# Patient Record
Sex: Female | Born: 1955 | Race: Black or African American | Hispanic: No | Marital: Married | Smoking: Former smoker
Health system: Southern US, Community
[De-identification: ages and names within clinical notes are randomized; demographics above are authoritative.]

## PROBLEM LIST (undated history)

## (undated) DIAGNOSIS — I639 Cerebral infarction, unspecified: Secondary | ICD-10-CM

## (undated) DIAGNOSIS — C189 Malignant neoplasm of colon, unspecified: Secondary | ICD-10-CM

## (undated) HISTORY — PX: SHOULDER SURGERY: SHX246

## (undated) HISTORY — PX: ABDOMINAL HYSTERECTOMY: SHX81

---

## 2020-11-12 DIAGNOSIS — Z1231 Encounter for screening mammogram for malignant neoplasm of breast: Secondary | ICD-10-CM | POA: Diagnosis not present

## 2020-11-12 DIAGNOSIS — Z0000 Encounter for general adult medical examination without abnormal findings: Secondary | ICD-10-CM | POA: Diagnosis not present

## 2020-11-12 DIAGNOSIS — Z85038 Personal history of other malignant neoplasm of large intestine: Secondary | ICD-10-CM | POA: Diagnosis not present

## 2020-11-12 DIAGNOSIS — Z780 Asymptomatic menopausal state: Secondary | ICD-10-CM | POA: Diagnosis not present

## 2020-11-12 DIAGNOSIS — M25511 Pain in right shoulder: Secondary | ICD-10-CM | POA: Diagnosis not present

## 2020-11-12 DIAGNOSIS — Z1509 Genetic susceptibility to other malignant neoplasm: Secondary | ICD-10-CM | POA: Diagnosis not present

## 2020-11-13 ENCOUNTER — Other Ambulatory Visit: Payer: Self-pay | Admitting: Internal Medicine

## 2020-11-13 DIAGNOSIS — Z1231 Encounter for screening mammogram for malignant neoplasm of breast: Secondary | ICD-10-CM

## 2021-01-28 DIAGNOSIS — Z1509 Genetic susceptibility to other malignant neoplasm: Secondary | ICD-10-CM | POA: Diagnosis not present

## 2021-01-28 DIAGNOSIS — K5904 Chronic idiopathic constipation: Secondary | ICD-10-CM | POA: Diagnosis not present

## 2021-01-28 DIAGNOSIS — Z85038 Personal history of other malignant neoplasm of large intestine: Secondary | ICD-10-CM | POA: Diagnosis not present

## 2021-01-28 DIAGNOSIS — Z8673 Personal history of transient ischemic attack (TIA), and cerebral infarction without residual deficits: Secondary | ICD-10-CM | POA: Diagnosis not present

## 2021-03-18 DIAGNOSIS — C182 Malignant neoplasm of ascending colon: Secondary | ICD-10-CM | POA: Diagnosis not present

## 2021-03-18 DIAGNOSIS — I10 Essential (primary) hypertension: Secondary | ICD-10-CM | POA: Diagnosis not present

## 2021-03-18 DIAGNOSIS — K219 Gastro-esophageal reflux disease without esophagitis: Secondary | ICD-10-CM | POA: Diagnosis not present

## 2021-03-18 DIAGNOSIS — M75111 Incomplete rotator cuff tear or rupture of right shoulder, not specified as traumatic: Secondary | ICD-10-CM | POA: Diagnosis not present

## 2021-04-08 DIAGNOSIS — Z980 Intestinal bypass and anastomosis status: Secondary | ICD-10-CM | POA: Diagnosis not present

## 2021-04-08 DIAGNOSIS — K449 Diaphragmatic hernia without obstruction or gangrene: Secondary | ICD-10-CM | POA: Diagnosis not present

## 2021-04-08 DIAGNOSIS — K2950 Unspecified chronic gastritis without bleeding: Secondary | ICD-10-CM | POA: Diagnosis not present

## 2021-04-08 DIAGNOSIS — K5730 Diverticulosis of large intestine without perforation or abscess without bleeding: Secondary | ICD-10-CM | POA: Diagnosis not present

## 2021-04-08 DIAGNOSIS — Z1509 Genetic susceptibility to other malignant neoplasm: Secondary | ICD-10-CM | POA: Diagnosis not present

## 2021-04-08 DIAGNOSIS — Z85038 Personal history of other malignant neoplasm of large intestine: Secondary | ICD-10-CM | POA: Diagnosis not present

## 2021-04-08 DIAGNOSIS — K640 First degree hemorrhoids: Secondary | ICD-10-CM | POA: Diagnosis not present

## 2021-04-08 DIAGNOSIS — K222 Esophageal obstruction: Secondary | ICD-10-CM | POA: Diagnosis not present

## 2021-04-08 DIAGNOSIS — K641 Second degree hemorrhoids: Secondary | ICD-10-CM | POA: Diagnosis not present

## 2021-07-18 DIAGNOSIS — R6889 Other general symptoms and signs: Secondary | ICD-10-CM | POA: Diagnosis not present

## 2021-07-18 DIAGNOSIS — Z03818 Encounter for observation for suspected exposure to other biological agents ruled out: Secondary | ICD-10-CM | POA: Diagnosis not present

## 2021-07-18 DIAGNOSIS — R509 Fever, unspecified: Secondary | ICD-10-CM | POA: Diagnosis not present

## 2021-07-18 DIAGNOSIS — R519 Headache, unspecified: Secondary | ICD-10-CM | POA: Diagnosis not present

## 2021-07-18 DIAGNOSIS — R110 Nausea: Secondary | ICD-10-CM | POA: Diagnosis not present

## 2021-07-22 DIAGNOSIS — Z1509 Genetic susceptibility to other malignant neoplasm: Secondary | ICD-10-CM | POA: Diagnosis not present

## 2021-07-22 DIAGNOSIS — G8929 Other chronic pain: Secondary | ICD-10-CM | POA: Diagnosis not present

## 2021-07-22 DIAGNOSIS — Z124 Encounter for screening for malignant neoplasm of cervix: Secondary | ICD-10-CM | POA: Diagnosis not present

## 2021-07-22 DIAGNOSIS — M25511 Pain in right shoulder: Secondary | ICD-10-CM | POA: Diagnosis not present

## 2021-07-22 DIAGNOSIS — I10 Essential (primary) hypertension: Secondary | ICD-10-CM | POA: Diagnosis not present

## 2021-08-05 DIAGNOSIS — K529 Noninfective gastroenteritis and colitis, unspecified: Secondary | ICD-10-CM | POA: Diagnosis not present

## 2021-08-05 DIAGNOSIS — M25511 Pain in right shoulder: Secondary | ICD-10-CM | POA: Diagnosis not present

## 2021-08-05 DIAGNOSIS — I10 Essential (primary) hypertension: Secondary | ICD-10-CM | POA: Diagnosis not present

## 2021-08-29 DIAGNOSIS — M7581 Other shoulder lesions, right shoulder: Secondary | ICD-10-CM | POA: Diagnosis not present

## 2021-08-29 DIAGNOSIS — M7501 Adhesive capsulitis of right shoulder: Secondary | ICD-10-CM | POA: Diagnosis not present

## 2021-08-29 DIAGNOSIS — Z9889 Other specified postprocedural states: Secondary | ICD-10-CM | POA: Diagnosis not present

## 2021-09-15 DIAGNOSIS — M7581 Other shoulder lesions, right shoulder: Secondary | ICD-10-CM | POA: Diagnosis not present

## 2021-09-15 DIAGNOSIS — M6281 Muscle weakness (generalized): Secondary | ICD-10-CM | POA: Diagnosis not present

## 2021-09-15 DIAGNOSIS — M7501 Adhesive capsulitis of right shoulder: Secondary | ICD-10-CM | POA: Diagnosis not present

## 2021-09-15 DIAGNOSIS — M2560 Stiffness of unspecified joint, not elsewhere classified: Secondary | ICD-10-CM | POA: Diagnosis not present

## 2021-09-16 DIAGNOSIS — K219 Gastro-esophageal reflux disease without esophagitis: Secondary | ICD-10-CM | POA: Diagnosis not present

## 2021-09-16 DIAGNOSIS — I10 Essential (primary) hypertension: Secondary | ICD-10-CM | POA: Diagnosis not present

## 2021-09-16 DIAGNOSIS — Z85038 Personal history of other malignant neoplasm of large intestine: Secondary | ICD-10-CM | POA: Diagnosis not present

## 2021-09-16 DIAGNOSIS — M25511 Pain in right shoulder: Secondary | ICD-10-CM | POA: Diagnosis not present

## 2021-09-16 DIAGNOSIS — Z1509 Genetic susceptibility to other malignant neoplasm: Secondary | ICD-10-CM | POA: Diagnosis not present

## 2021-09-16 DIAGNOSIS — R1084 Generalized abdominal pain: Secondary | ICD-10-CM | POA: Diagnosis not present

## 2021-09-17 ENCOUNTER — Other Ambulatory Visit: Payer: Self-pay | Admitting: Internal Medicine

## 2021-09-17 DIAGNOSIS — R1084 Generalized abdominal pain: Secondary | ICD-10-CM

## 2021-09-19 DIAGNOSIS — M7501 Adhesive capsulitis of right shoulder: Secondary | ICD-10-CM | POA: Diagnosis not present

## 2021-09-30 ENCOUNTER — Ambulatory Visit: Admission: RE | Admit: 2021-09-30 | Payer: Medicare HMO | Source: Ambulatory Visit

## 2021-10-07 DIAGNOSIS — M7501 Adhesive capsulitis of right shoulder: Secondary | ICD-10-CM | POA: Diagnosis not present

## 2021-10-09 DIAGNOSIS — Z0189 Encounter for other specified special examinations: Secondary | ICD-10-CM | POA: Diagnosis not present

## 2021-10-09 DIAGNOSIS — R1084 Generalized abdominal pain: Secondary | ICD-10-CM | POA: Diagnosis not present

## 2021-10-10 ENCOUNTER — Ambulatory Visit
Admission: RE | Admit: 2021-10-10 | Discharge: 2021-10-10 | Disposition: A | Discharge status: Home / Self Care | Payer: Medicare HMO | Source: Ambulatory Visit | Attending: Internal Medicine | Admitting: Internal Medicine

## 2021-10-10 ENCOUNTER — Other Ambulatory Visit: Payer: Self-pay

## 2021-10-10 DIAGNOSIS — I700 Atherosclerosis of aorta: Secondary | ICD-10-CM | POA: Diagnosis not present

## 2021-10-10 DIAGNOSIS — K449 Diaphragmatic hernia without obstruction or gangrene: Secondary | ICD-10-CM | POA: Diagnosis not present

## 2021-10-10 DIAGNOSIS — R1084 Generalized abdominal pain: Secondary | ICD-10-CM | POA: Insufficient documentation

## 2021-10-10 MED ORDER — IOHEXOL 300 MG/ML  SOLN
100.0000 mL | Freq: Once | INTRAMUSCULAR | Status: AC | PRN
  Administered 2021-10-10: 100 mL via INTRAVENOUS

## 2021-10-14 DIAGNOSIS — M7501 Adhesive capsulitis of right shoulder: Secondary | ICD-10-CM | POA: Diagnosis not present

## 2021-10-16 DIAGNOSIS — M7501 Adhesive capsulitis of right shoulder: Secondary | ICD-10-CM | POA: Diagnosis not present

## 2021-10-20 DIAGNOSIS — M7501 Adhesive capsulitis of right shoulder: Secondary | ICD-10-CM | POA: Diagnosis not present

## 2021-10-31 DIAGNOSIS — M7501 Adhesive capsulitis of right shoulder: Secondary | ICD-10-CM | POA: Diagnosis not present

## 2021-11-05 DIAGNOSIS — M7501 Adhesive capsulitis of right shoulder: Secondary | ICD-10-CM | POA: Diagnosis not present

## 2021-11-13 DIAGNOSIS — M7501 Adhesive capsulitis of right shoulder: Secondary | ICD-10-CM | POA: Diagnosis not present

## 2021-11-19 DIAGNOSIS — Z1509 Genetic susceptibility to other malignant neoplasm: Secondary | ICD-10-CM | POA: Diagnosis not present

## 2021-11-19 DIAGNOSIS — I10 Essential (primary) hypertension: Secondary | ICD-10-CM | POA: Diagnosis not present

## 2021-11-19 DIAGNOSIS — R7303 Prediabetes: Secondary | ICD-10-CM | POA: Diagnosis not present

## 2021-11-19 DIAGNOSIS — C182 Malignant neoplasm of ascending colon: Secondary | ICD-10-CM | POA: Diagnosis not present

## 2021-11-19 DIAGNOSIS — Z1389 Encounter for screening for other disorder: Secondary | ICD-10-CM | POA: Diagnosis not present

## 2021-11-19 DIAGNOSIS — Z780 Asymptomatic menopausal state: Secondary | ICD-10-CM | POA: Diagnosis not present

## 2021-11-19 DIAGNOSIS — Z23 Encounter for immunization: Secondary | ICD-10-CM | POA: Diagnosis not present

## 2021-11-19 DIAGNOSIS — Z85038 Personal history of other malignant neoplasm of large intestine: Secondary | ICD-10-CM | POA: Diagnosis not present

## 2021-11-19 DIAGNOSIS — Z0000 Encounter for general adult medical examination without abnormal findings: Secondary | ICD-10-CM | POA: Diagnosis not present

## 2021-11-19 DIAGNOSIS — Z1231 Encounter for screening mammogram for malignant neoplasm of breast: Secondary | ICD-10-CM | POA: Diagnosis not present

## 2021-11-20 ENCOUNTER — Other Ambulatory Visit: Payer: Self-pay | Admitting: Internal Medicine

## 2021-11-20 DIAGNOSIS — Z1231 Encounter for screening mammogram for malignant neoplasm of breast: Secondary | ICD-10-CM

## 2021-12-26 ENCOUNTER — Other Ambulatory Visit: Payer: Self-pay | Admitting: Internal Medicine

## 2021-12-26 DIAGNOSIS — Z1231 Encounter for screening mammogram for malignant neoplasm of breast: Secondary | ICD-10-CM

## 2022-01-08 ENCOUNTER — Ambulatory Visit
Admission: RE | Admit: 2022-01-08 | Discharge: 2022-01-08 | Disposition: A | Discharge status: Home / Self Care | Payer: Medicare PPO | Source: Ambulatory Visit | Attending: Internal Medicine | Admitting: Internal Medicine

## 2022-01-08 DIAGNOSIS — Z1231 Encounter for screening mammogram for malignant neoplasm of breast: Secondary | ICD-10-CM | POA: Diagnosis present

## 2022-01-27 ENCOUNTER — Inpatient Hospital Stay
Admission: RE | Admit: 2022-01-27 | Discharge: 2022-01-27 | Disposition: A | Discharge status: Home / Self Care | Payer: Self-pay | Source: Ambulatory Visit | Attending: *Deleted | Admitting: *Deleted

## 2022-01-27 ENCOUNTER — Other Ambulatory Visit: Payer: Self-pay | Admitting: *Deleted

## 2022-01-27 DIAGNOSIS — Z1231 Encounter for screening mammogram for malignant neoplasm of breast: Secondary | ICD-10-CM

## 2022-01-28 ENCOUNTER — Other Ambulatory Visit: Payer: Self-pay | Admitting: Internal Medicine

## 2022-01-29 ENCOUNTER — Other Ambulatory Visit: Payer: Self-pay | Admitting: Internal Medicine

## 2022-01-29 DIAGNOSIS — N6489 Other specified disorders of breast: Secondary | ICD-10-CM

## 2022-01-29 DIAGNOSIS — R928 Other abnormal and inconclusive findings on diagnostic imaging of breast: Secondary | ICD-10-CM

## 2022-02-09 ENCOUNTER — Ambulatory Visit
Admission: RE | Admit: 2022-02-09 | Discharge: 2022-02-09 | Disposition: A | Discharge status: Home / Self Care | Payer: Medicare PPO | Source: Ambulatory Visit | Attending: Internal Medicine | Admitting: Internal Medicine

## 2022-02-09 DIAGNOSIS — R928 Other abnormal and inconclusive findings on diagnostic imaging of breast: Secondary | ICD-10-CM | POA: Insufficient documentation

## 2022-02-09 DIAGNOSIS — N6489 Other specified disorders of breast: Secondary | ICD-10-CM | POA: Diagnosis present

## 2022-02-25 ENCOUNTER — Ambulatory Visit
Admission: RE | Admit: 2022-02-25 | Discharge: 2022-02-25 | Disposition: A | Discharge status: Home / Self Care | Payer: Medicare PPO | Source: Ambulatory Visit | Attending: Internal Medicine | Admitting: Internal Medicine

## 2022-02-25 ENCOUNTER — Other Ambulatory Visit: Payer: Self-pay | Admitting: Internal Medicine

## 2022-02-25 ENCOUNTER — Other Ambulatory Visit: Payer: Medicare PPO

## 2022-02-25 DIAGNOSIS — S86911S Strain of unspecified muscle(s) and tendon(s) at lower leg level, right leg, sequela: Secondary | ICD-10-CM

## 2022-02-25 DIAGNOSIS — M25561 Pain in right knee: Secondary | ICD-10-CM

## 2022-02-25 DIAGNOSIS — S86911A Strain of unspecified muscle(s) and tendon(s) at lower leg level, right leg, initial encounter: Secondary | ICD-10-CM

## 2022-03-02 ENCOUNTER — Other Ambulatory Visit: Payer: Self-pay | Admitting: Internal Medicine

## 2022-03-02 DIAGNOSIS — N63 Unspecified lump in unspecified breast: Secondary | ICD-10-CM

## 2022-03-02 DIAGNOSIS — R928 Other abnormal and inconclusive findings on diagnostic imaging of breast: Secondary | ICD-10-CM

## 2022-03-09 ENCOUNTER — Other Ambulatory Visit: Payer: Self-pay | Admitting: Internal Medicine

## 2022-03-09 ENCOUNTER — Other Ambulatory Visit: Payer: Medicare PPO

## 2022-03-09 DIAGNOSIS — Z13820 Encounter for screening for osteoporosis: Secondary | ICD-10-CM

## 2022-04-07 ENCOUNTER — Ambulatory Visit
Admission: RE | Admit: 2022-04-07 | Discharge: 2022-04-07 | Disposition: A | Discharge status: Home / Self Care | Payer: Medicare PPO | Source: Ambulatory Visit | Attending: Internal Medicine | Admitting: Internal Medicine

## 2022-04-07 DIAGNOSIS — R928 Other abnormal and inconclusive findings on diagnostic imaging of breast: Secondary | ICD-10-CM

## 2022-04-07 DIAGNOSIS — N63 Unspecified lump in unspecified breast: Secondary | ICD-10-CM

## 2022-04-07 HISTORY — PX: BREAST BIOPSY: SHX20

## 2022-04-09 LAB — SURGICAL PATHOLOGY

## 2022-04-21 ENCOUNTER — Other Ambulatory Visit: Payer: Self-pay | Admitting: Internal Medicine

## 2022-04-21 DIAGNOSIS — R928 Other abnormal and inconclusive findings on diagnostic imaging of breast: Secondary | ICD-10-CM

## 2022-04-21 DIAGNOSIS — N6489 Other specified disorders of breast: Secondary | ICD-10-CM

## 2022-04-21 DIAGNOSIS — R921 Mammographic calcification found on diagnostic imaging of breast: Secondary | ICD-10-CM

## 2022-05-05 ENCOUNTER — Ambulatory Visit
Admission: RE | Admit: 2022-05-05 | Discharge: 2022-05-05 | Disposition: A | Discharge status: Home / Self Care | Payer: Medicare PPO | Source: Ambulatory Visit | Attending: Internal Medicine | Admitting: Internal Medicine

## 2022-05-05 DIAGNOSIS — N6489 Other specified disorders of breast: Secondary | ICD-10-CM | POA: Insufficient documentation

## 2022-05-05 DIAGNOSIS — R921 Mammographic calcification found on diagnostic imaging of breast: Secondary | ICD-10-CM | POA: Insufficient documentation

## 2022-05-05 DIAGNOSIS — R928 Other abnormal and inconclusive findings on diagnostic imaging of breast: Secondary | ICD-10-CM | POA: Insufficient documentation

## 2022-05-06 LAB — SURGICAL PATHOLOGY

## 2022-06-12 ENCOUNTER — Other Ambulatory Visit: Payer: Medicare PPO

## 2022-06-22 ENCOUNTER — Ambulatory Visit: Payer: Medicare PPO | Admitting: Physical Therapy

## 2022-06-23 ENCOUNTER — Other Ambulatory Visit: Payer: Medicare PPO

## 2022-09-28 ENCOUNTER — Other Ambulatory Visit: Payer: Self-pay | Admitting: Internal Medicine

## 2022-09-28 ENCOUNTER — Ambulatory Visit
Admission: RE | Admit: 2022-09-28 | Discharge: 2022-09-28 | Disposition: A | Discharge status: Home / Self Care | Payer: Medicare HMO | Source: Ambulatory Visit | Attending: Internal Medicine | Admitting: Internal Medicine

## 2022-09-28 DIAGNOSIS — R1013 Epigastric pain: Secondary | ICD-10-CM

## 2022-10-26 ENCOUNTER — Other Ambulatory Visit: Payer: Medicare PPO

## 2022-11-04 ENCOUNTER — Other Ambulatory Visit: Payer: Self-pay | Admitting: Internal Medicine

## 2022-11-04 DIAGNOSIS — Z13820 Encounter for screening for osteoporosis: Secondary | ICD-10-CM

## 2022-11-26 ENCOUNTER — Other Ambulatory Visit: Payer: Medicare HMO

## 2022-12-06 IMAGING — MG MM DIGITAL DIAGNOSTIC UNILAT*L* W/ TOMO W/ CAD
7 series · 8 of 15 positions shown · non-contrast
Comparison: Previous exam(s).

CLINICAL DATA: Patient returns after screening study for evaluation
of possible LEFT breast asymmetry and calcifications.

EXAM:
DIGITAL DIAGNOSTIC UNILATERAL LEFT MAMMOGRAM WITH TOMOSYNTHESIS AND
CAD; ULTRASOUND LEFT BREAST LIMITED
TECHNIQUE: Left digital diagnostic mammography and breast tomosynthesis was
performed. The images were evaluated with computer-aided detection.;
Targeted ultrasound examination of the left breast was performed.

[L CC]
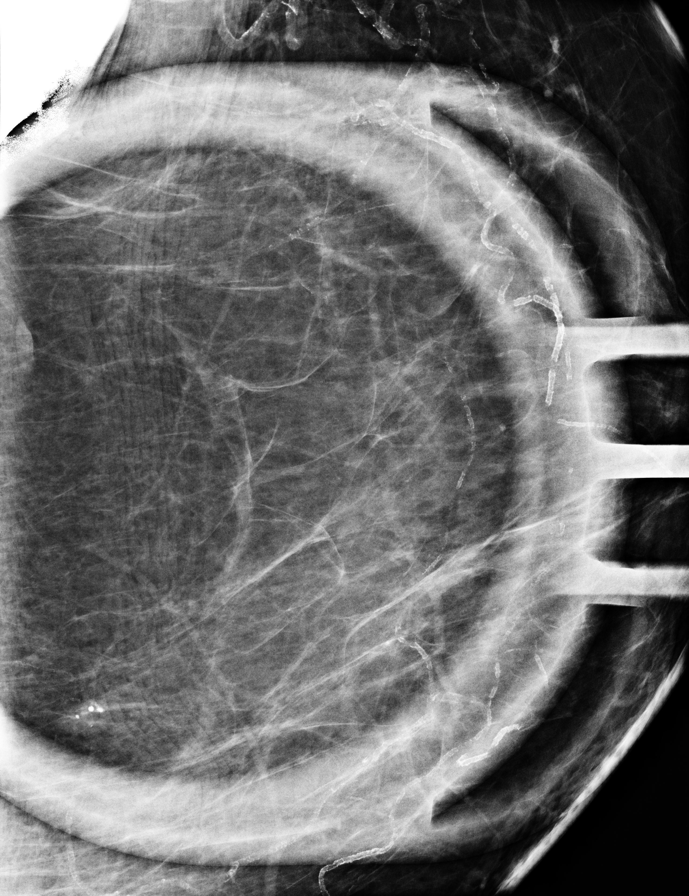

[L ML (1 of 2)]
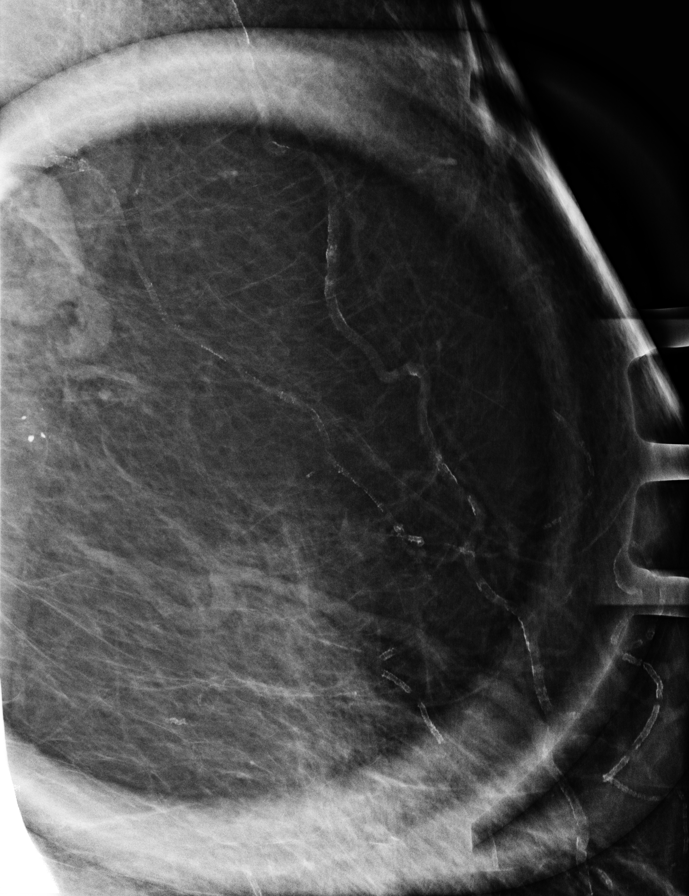

[L ML (2 of 2)]
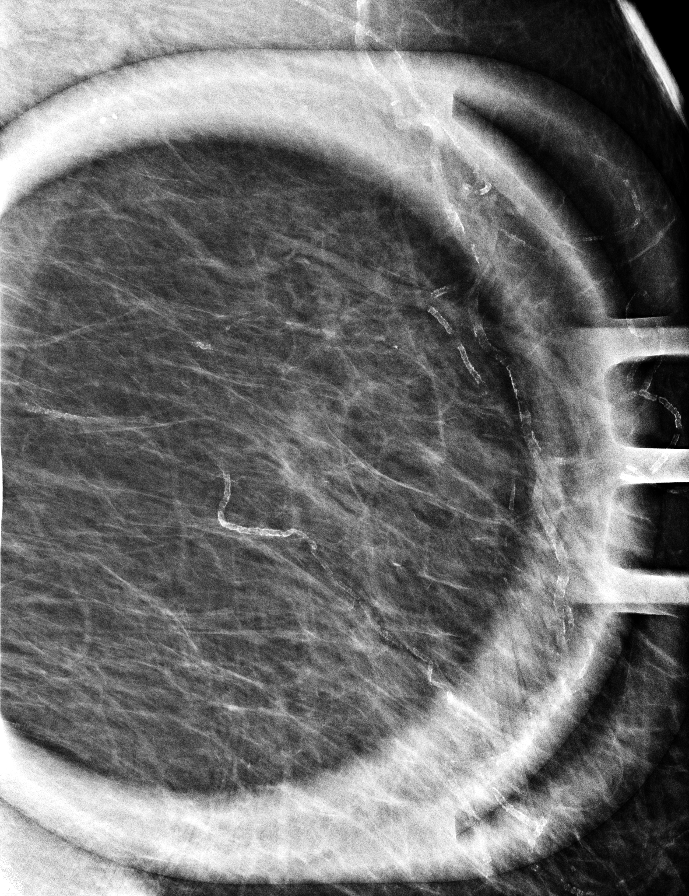

[L ML synth-2D]
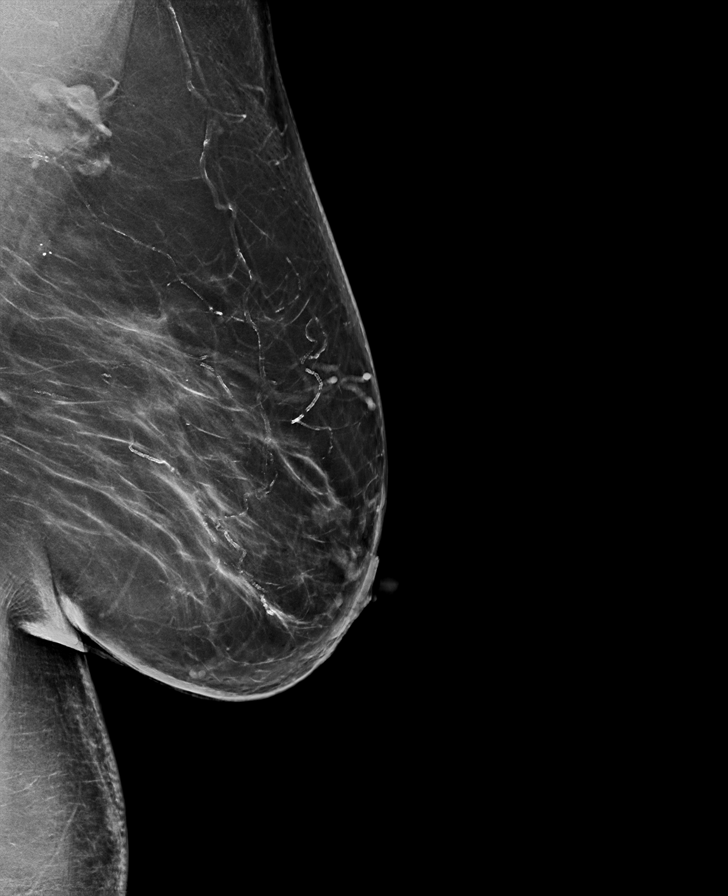

[L MLO synth-2D]
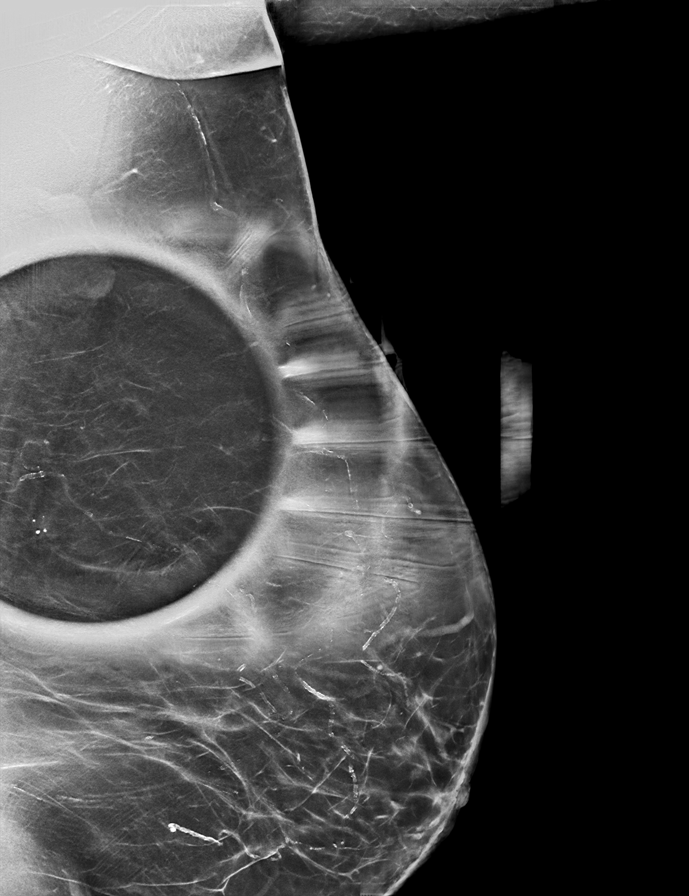

[L ML tomo · 2 of 102 frames shown]
[frame 33/102]
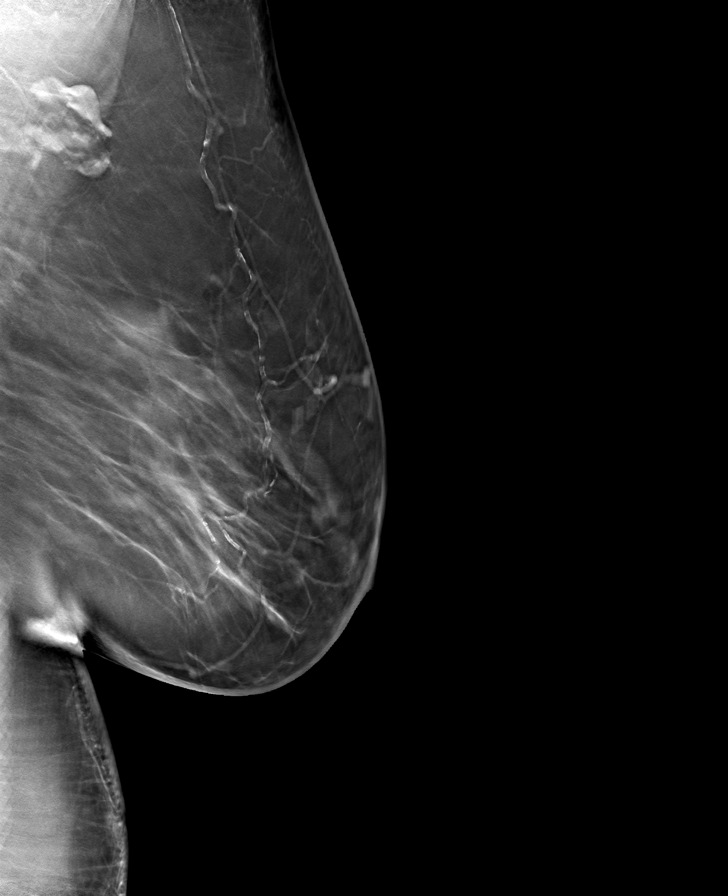
[frame 51/102]
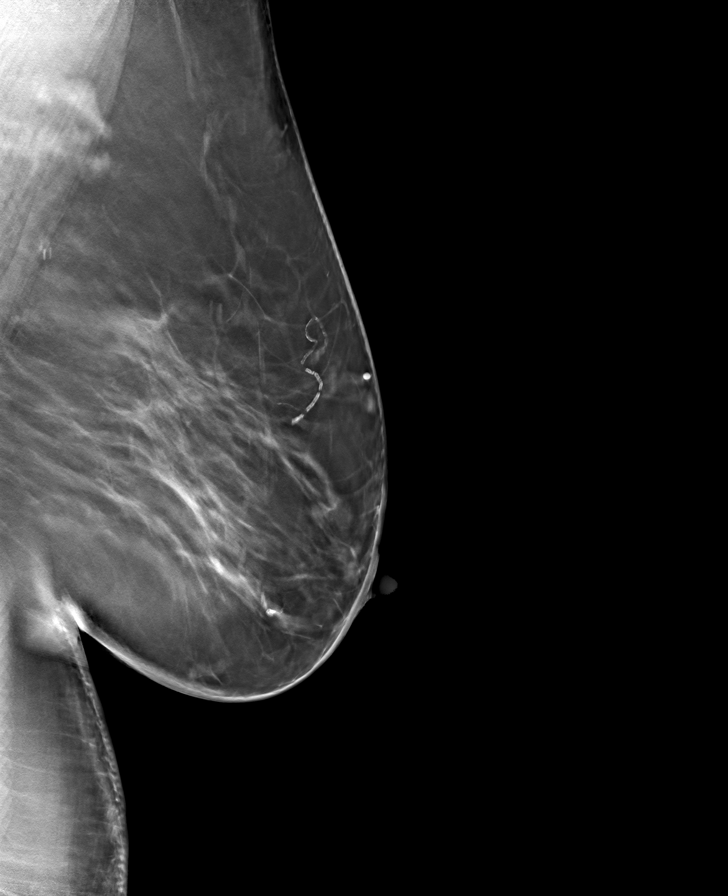

[L MLO tomo · tomo slice 44/87.0]
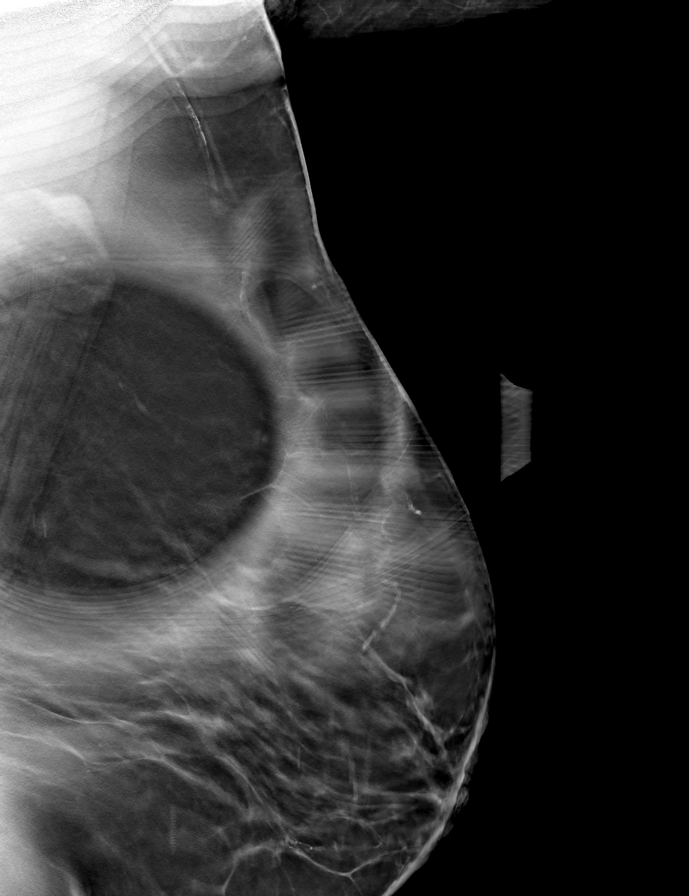

[8 of 15 positions shown; findings below may reference images not displayed]

ACR Breast Density Category b: There are scattered areas of
fibroglandular density.
FINDINGS: Additional 2-D and 3-D images are performed. These views show a
group of calcifications spanning 0.8 centimeters in the posterior
UPPER INNER QUADRANT of the LEFT breast. There is an associated
parenchymal density.

On physical exam, palpate no abnormality in the UPPER INNER QUADRANT
of the LEFT breast.

Targeted ultrasound is performed, showing an anechoic oval
circumscribed mass with internal debris and calcifications in the 11
o'clock location of the LEFT breast 6 centimeters from the nipple.
This measures 0.7 x 0.3 by 0.8 centimeters. An adjacent group of
microcysts measures 1.0 x 0.4 x 1 2 centimeters. No internal blood
flow identified within either lesion.
IMPRESSION: Probably benign fibrocystic changes in the 11 o'clock location of
the LEFT breast. We discussed management options including excision,
biopsy, and close follow-up. Imaging followup is recommended at 6,
12, and 24 months to assess stability. The patient concurs with this
plan.

RECOMMENDATION:
Recommend LEFT breast ultrasound in 6 months to assess stability.

I have discussed the findings and recommendations with the patient.
If applicable, a reminder letter will be sent to the patient
regarding the next appointment.

BI-RADS CATEGORY  3: Probably benign.

## 2023-02-02 ENCOUNTER — Ambulatory Visit: Payer: Medicare HMO

## 2023-02-02 DIAGNOSIS — K219 Gastro-esophageal reflux disease without esophagitis: Secondary | ICD-10-CM

## 2023-02-02 DIAGNOSIS — Z1509 Genetic susceptibility to other malignant neoplasm: Secondary | ICD-10-CM

## 2023-02-02 DIAGNOSIS — K643 Fourth degree hemorrhoids: Secondary | ICD-10-CM

## 2023-02-02 DIAGNOSIS — Z980 Intestinal bypass and anastomosis status: Secondary | ICD-10-CM

## 2023-02-02 DIAGNOSIS — Z85038 Personal history of other malignant neoplasm of large intestine: Secondary | ICD-10-CM

## 2023-02-02 DIAGNOSIS — Z09 Encounter for follow-up examination after completed treatment for conditions other than malignant neoplasm: Secondary | ICD-10-CM | POA: Diagnosis not present

## 2023-02-02 DIAGNOSIS — K642 Third degree hemorrhoids: Secondary | ICD-10-CM

## 2023-02-09 ENCOUNTER — Other Ambulatory Visit: Payer: Self-pay | Admitting: Internal Medicine

## 2023-02-09 DIAGNOSIS — Z1231 Encounter for screening mammogram for malignant neoplasm of breast: Secondary | ICD-10-CM

## 2023-02-16 ENCOUNTER — Ambulatory Visit
Admission: RE | Admit: 2023-02-16 | Discharge: 2023-02-16 | Disposition: A | Discharge status: Home / Self Care | Payer: Medicare HMO | Source: Ambulatory Visit | Attending: Internal Medicine | Admitting: Internal Medicine

## 2023-02-16 DIAGNOSIS — Z1231 Encounter for screening mammogram for malignant neoplasm of breast: Secondary | ICD-10-CM

## 2023-03-31 ENCOUNTER — Other Ambulatory Visit: Payer: Self-pay

## 2023-03-31 ENCOUNTER — Encounter (HOSPITAL_COMMUNITY): Payer: Self-pay

## 2023-03-31 ENCOUNTER — Emergency Department (HOSPITAL_COMMUNITY)
Admission: EM | Admit: 2023-03-31 | Discharge: 2023-03-31 | Discharge status: Against Medical Advice | Payer: Medicare HMO | Attending: Emergency Medicine | Admitting: Emergency Medicine

## 2023-03-31 DIAGNOSIS — R630 Anorexia: Secondary | ICD-10-CM | POA: Insufficient documentation

## 2023-03-31 DIAGNOSIS — J029 Acute pharyngitis, unspecified: Secondary | ICD-10-CM | POA: Insufficient documentation

## 2023-03-31 DIAGNOSIS — Z1152 Encounter for screening for COVID-19: Secondary | ICD-10-CM | POA: Diagnosis not present

## 2023-03-31 DIAGNOSIS — R509 Fever, unspecified: Secondary | ICD-10-CM | POA: Insufficient documentation

## 2023-03-31 DIAGNOSIS — Z5321 Procedure and treatment not carried out due to patient leaving prior to being seen by health care provider: Secondary | ICD-10-CM | POA: Insufficient documentation

## 2023-03-31 HISTORY — DX: Malignant neoplasm of colon, unspecified: C18.9

## 2023-03-31 HISTORY — DX: Cerebral infarction, unspecified: I63.9

## 2023-03-31 LAB — SARS CORONAVIRUS 2 BY RT PCR: SARS Coronavirus 2 by RT PCR: NEGATIVE

## 2023-03-31 NOTE — ED Triage Notes (Signed)
Pt c/o sore throat, fever, chills, loss of appetitex2d. Pt states she didn't take her temp. Pt states she been sweating a lot. Pt states it took a home COVID test and it was neg. Pt just got back from [place].

## 2023-03-31 NOTE — ED Notes (Signed)
Awaiting patient from lobby 

## 2023-12-19 ENCOUNTER — Other Ambulatory Visit: Payer: Self-pay

## 2023-12-19 ENCOUNTER — Encounter (HOSPITAL_BASED_OUTPATIENT_CLINIC_OR_DEPARTMENT_OTHER): Payer: Self-pay

## 2023-12-19 ENCOUNTER — Emergency Department (HOSPITAL_BASED_OUTPATIENT_CLINIC_OR_DEPARTMENT_OTHER)

## 2023-12-19 ENCOUNTER — Emergency Department (HOSPITAL_BASED_OUTPATIENT_CLINIC_OR_DEPARTMENT_OTHER)
Admission: EM | Admit: 2023-12-19 | Discharge: 2023-12-19 | Disposition: A | Discharge status: Home / Self Care | Attending: Emergency Medicine | Admitting: Emergency Medicine

## 2023-12-19 ENCOUNTER — Telehealth (HOSPITAL_BASED_OUTPATIENT_CLINIC_OR_DEPARTMENT_OTHER): Payer: Self-pay | Admitting: Emergency Medicine

## 2023-12-19 ENCOUNTER — Emergency Department (HOSPITAL_COMMUNITY)

## 2023-12-19 DIAGNOSIS — R1110 Vomiting, unspecified: Secondary | ICD-10-CM | POA: Diagnosis not present

## 2023-12-19 DIAGNOSIS — Z85038 Personal history of other malignant neoplasm of large intestine: Secondary | ICD-10-CM | POA: Diagnosis not present

## 2023-12-19 DIAGNOSIS — I700 Atherosclerosis of aorta: Secondary | ICD-10-CM | POA: Insufficient documentation

## 2023-12-19 DIAGNOSIS — R9082 White matter disease, unspecified: Secondary | ICD-10-CM | POA: Diagnosis not present

## 2023-12-19 DIAGNOSIS — Z8673 Personal history of transient ischemic attack (TIA), and cerebral infarction without residual deficits: Secondary | ICD-10-CM | POA: Insufficient documentation

## 2023-12-19 DIAGNOSIS — Z7982 Long term (current) use of aspirin: Secondary | ICD-10-CM | POA: Diagnosis not present

## 2023-12-19 DIAGNOSIS — R42 Dizziness and giddiness: Secondary | ICD-10-CM | POA: Diagnosis not present

## 2023-12-19 LAB — PROTIME-INR
INR: 0.9 (ref 0.8–1.2)
Prothrombin Time: 12.5 s (ref 11.4–15.2)

## 2023-12-19 LAB — COMPREHENSIVE METABOLIC PANEL WITH GFR
ALT: 24 U/L (ref 0–44)
AST: 25 U/L (ref 15–41)
Albumin: 4.1 g/dL (ref 3.5–5.0)
Alkaline Phosphatase: 59 U/L (ref 38–126)
Anion gap: 7 (ref 5–15)
BUN: 16 mg/dL (ref 8–23)
CO2: 30 mmol/L (ref 22–32)
Calcium: 9.4 mg/dL (ref 8.9–10.3)
Chloride: 100 mmol/L (ref 98–111)
Creatinine, Ser: 0.66 mg/dL (ref 0.44–1.00)
GFR, Estimated: >60 mL/min (ref >60)
Glucose, Bld: 108 mg/dL — ABNORMAL HIGH (ref 70–99)
Potassium: 3.4 mmol/L — ABNORMAL LOW (ref 3.5–5.1)
Sodium: 137 mmol/L (ref 135–145)
Total Bilirubin: 0.4 mg/dL (ref 0.0–1.2)
Total Protein: 6.8 g/dL (ref 6.5–8.1)

## 2023-12-19 LAB — CBC
HCT: 35.6 % — ABNORMAL LOW (ref 36.0–46.0)
Hemoglobin: 11.7 g/dL — ABNORMAL LOW (ref 12.0–15.0)
MCH: 29 pg (ref 26.0–34.0)
MCHC: 32.9 g/dL (ref 30.0–36.0)
MCV: 88.1 fL (ref 80.0–100.0)
Platelets: 215 10*3/uL (ref 150–400)
RBC: 4.04 MIL/uL (ref 3.87–5.11)
RDW: 14.3 % (ref 11.5–15.5)
WBC: 9.9 10*3/uL (ref 4.0–10.5)
nRBC: 0 % (ref 0.0–0.2)

## 2023-12-19 LAB — RAPID URINE DRUG SCREEN, HOSP PERFORMED
Amphetamines: NOT DETECTED
Barbiturates: NOT DETECTED
Benzodiazepines: NOT DETECTED
Cocaine: NOT DETECTED
Opiates: NOT DETECTED
Tetrahydrocannabinol: POSITIVE — AB

## 2023-12-19 LAB — URINALYSIS, ROUTINE W REFLEX MICROSCOPIC
Bilirubin Urine: NEGATIVE
Glucose, UA: NEGATIVE mg/dL
Hgb urine dipstick: NEGATIVE
Ketones, ur: NEGATIVE mg/dL
Leukocytes,Ua: NEGATIVE
Nitrite: NEGATIVE
Specific Gravity, Urine: >1.046 — ABNORMAL HIGH (ref 1.005–1.030)
pH: 6.5 (ref 5.0–8.0)

## 2023-12-19 LAB — ETHANOL: Alcohol, Ethyl (B): <10 mg/dL (ref <10)

## 2023-12-19 LAB — CBG MONITORING, ED: Glucose-Capillary: 118 mg/dL — ABNORMAL HIGH (ref 70–99)

## 2023-12-19 LAB — LIPASE, BLOOD: Lipase: 15 U/L (ref 11–51)

## 2023-12-19 LAB — APTT: aPTT: 27 s (ref 24–36)

## 2023-12-19 MED ORDER — ONDANSETRON 4 MG PO TBDP
4.0000 mg | ORAL_TABLET | Freq: Once | ORAL | Status: AC | PRN
  Administered 2023-12-19: 4 mg via ORAL
  Filled 2023-12-19: qty 1

## 2023-12-19 MED ORDER — MECLIZINE HCL 25 MG PO TABS: 25.0000 mg | ORAL_TABLET | Freq: Three times a day (TID) | ORAL | 0 refills | Status: AC | PRN

## 2023-12-19 MED ORDER — MECLIZINE HCL 25 MG PO TABS
25.0000 mg | ORAL_TABLET | Freq: Once | ORAL | Status: AC
  Administered 2023-12-19: 25 mg via ORAL
  Filled 2023-12-19: qty 1

## 2023-12-19 MED ORDER — IOHEXOL 350 MG/ML SOLN
75.0000 mL | Freq: Once | INTRAVENOUS | Status: AC | PRN
  Administered 2023-12-19: 75 mL via INTRAVENOUS

## 2023-12-19 MED ORDER — ONDANSETRON HCL 4 MG/2ML IJ SOLN
4.0000 mg | Freq: Once | INTRAMUSCULAR | Status: DC
  Filled 2023-12-19: qty 2

## 2023-12-19 MED ORDER — GADOBUTROL 1 MMOL/ML IV SOLN
8.0000 mL | Freq: Once | INTRAVENOUS | Status: AC | PRN
  Administered 2023-12-19: 8 mL via INTRAVENOUS

## 2023-12-19 NOTE — ED Notes (Signed)
Darlene Brewer with [facility] called for transport

## 2023-12-19 NOTE — ED Provider Notes (Signed)
  Physical Exam  BP (!) 152/75 (BP Location: Right Arm)   Pulse (!) 51   Temp 98.5 F (36.9 C)   Resp 16   Ht 5\' 3"  (1.6 m)   Wt 79 kg   SpO2 98%   BMI 30.85 kg/m   Physical Exam Vitals and nursing note reviewed.  Constitutional:      General: She is not in acute distress.    Appearance: She is well-developed.  HENT:     Head: Normocephalic and atraumatic.  Eyes:     Conjunctiva/sclera: Conjunctivae normal.  Cardiovascular:     Rate and Rhythm: Normal rate and regular rhythm.     Heart sounds: No murmur heard. Pulmonary:     Effort: Pulmonary effort is normal. No respiratory distress.     Breath sounds: Normal breath sounds.  Abdominal:     Palpations: Abdomen is soft.     Tenderness: There is no abdominal tenderness.  Musculoskeletal:        General: No swelling.     Cervical back: Neck supple.  Skin:    General: Skin is warm and dry.     Capillary Refill: Capillary refill takes less than 2 seconds.  Neurological:     General: No focal deficit present.     Mental Status: She is alert and oriented to person, place, and time.     GCS: GCS eye subscore is 4. GCS verbal subscore is 5. GCS motor subscore is 6.     Cranial Nerves: Cranial nerves 2-12 are intact. No cranial nerve deficit.     Sensory: Sensation is intact. No sensory deficit.     Motor: Motor function is intact. No weakness.     Comments: Intact Parinaud's and heel-to-shin.  CN III through XII intact.  No pronator drift or slurred speech.  5 out of 5 strength bilateral lower extremities.  5 out of 5 strength upper extremities.  Psychiatric:        Mood and Affect: Mood normal.     Procedures  Procedures  ED Course / MDM    Medical Decision Making Amount and/or Complexity of Data Reviewed Labs: ordered. Radiology: ordered.  Risk Prescription drug management.   68 year old female transferred from [facility] for need of MRI.  Please see the previous provider note for further details of  the event.  In short, 68 year old female who went to bed last night around midnight.  Patient woke up this morning around 5 to 6 AM with dizziness.  Reports that sitting still makes dizziness subside however movement of her head increases dizziness.  Reports that in the past she has had a stroke that presented just like this.  She has no other focal neurodeficits.  She has equal strength of bilateral lower extremities and upper extremities.  She was able to check across the midline.  She denies any one-sided weakness or numbness, nausea, vomiting, neck pain, headache, visual deficits. Have ordered MRI of brain to rule out CVA.  Provided patient with meclizine .  Patient reports that dizziness subsided with meclizine .  Will await results of MRI.  MRI resulted.  No acute abnormality noted.  Will discharge patient home at this time with meclizine .  Will have her follow-up with her PCP.  Stable.     Adel Aden, PA-C 12/19/23 1522    Long, Joshua G, MD 12/28/23 [phone]

## 2023-12-19 NOTE — ED Provider Notes (Signed)
  EMERGENCY DEPARTMENT AT [facility] Provider Note   CSN: [id number] Arrival date & time: 12/19/23  0830     History  Chief Complaint  Patient presents with   Emesis   Dizziness    Darlene Brewer is a 68 y.o. female.  Patient is a 68 year old female with past medical history of colon cancer and stroke presenting for complaints of dizziness.  Patient states that she awoke this morning at 5 to 6 AM with room spinning dizziness.  She went to bed at midnight feeling normal.  She denies any facial droop, slurred speech, confusion, sensation, or motor deficits.  She is able to walk with some difficulty.  She states the last time she had these dizziness symptoms she was diagnosed with a stroke.  Chart review demonstrates in February 2021 patient had an MRI demonstrating dominant right vertebral artery.Remote right cerebellar hemisphere infarct with associated volume loss. Remote right basal ganglia lacunar infarcts.  No recent falls or head trauma No blood thinner use.  Daily aspirin 81 mg only.    The history is provided by the patient (and daughter). No language interpreter was used.  Emesis Associated symptoms: no abdominal pain, no arthralgias, no chills, no cough, no fever and no sore throat   Dizziness Associated symptoms: vomiting   Associated symptoms: no chest pain, no palpitations and no shortness of breath        Home Medications Prior to Admission medications   Not on File      Allergies    Patient has no known allergies.    Review of Systems   Review of Systems  Constitutional:  Negative for chills and fever.  HENT:  Negative for ear pain and sore throat.   Eyes:  Negative for pain and visual disturbance.  Respiratory:  Negative for cough and shortness of breath.   Cardiovascular:  Negative for chest pain and palpitations.  Gastrointestinal:  Positive for vomiting. Negative for abdominal pain.  Genitourinary:  Negative for dysuria and  hematuria.  Musculoskeletal:  Negative for arthralgias and back pain.  Skin:  Negative for color change and rash.  Neurological:  Positive for dizziness. Negative for seizures and syncope.  All other systems reviewed and are negative.   Physical Exam Updated Vital Signs BP (!) 148/53   Pulse (!) 54   Temp 97.6 F (36.4 C) (Oral)   Resp 20   Ht 5\' 3"  (1.6 m)   Wt 79.9 kg   SpO2 100%   BMI 31.19 kg/m  Physical Exam Vitals and nursing note reviewed.  Constitutional:      General: She is not in acute distress.    Appearance: She is well-developed.  HENT:     Head: Normocephalic and atraumatic.  Eyes:     General: Lids are normal. Vision grossly intact.     Extraocular Movements: Extraocular movements intact.     Conjunctiva/sclera: Conjunctivae normal.     Pupils: Pupils are equal, round, and reactive to light.     Comments: No nystagmus  Cardiovascular:     Rate and Rhythm: Normal rate and regular rhythm.     Heart sounds: No murmur heard. Pulmonary:     Effort: Pulmonary effort is normal. No respiratory distress.     Breath sounds: Normal breath sounds.  Abdominal:     Palpations: Abdomen is soft.     Tenderness: There is no abdominal tenderness.  Musculoskeletal:        General: No swelling.  Cervical back: Neck supple.  Skin:    General: Skin is warm and dry.     Capillary Refill: Capillary refill takes less than 2 seconds.  Neurological:     General: No focal deficit present.     Mental Status: She is alert and oriented to person, place, and time.     GCS: GCS eye subscore is 4. GCS verbal subscore is 5. GCS motor subscore is 6.     Cranial Nerves: Cranial nerves 2-12 are intact.     Sensory: Sensation is intact.     Motor: Motor function is intact.     Coordination: Coordination is intact.  Psychiatric:        Mood and Affect: Mood normal.     ED Results / Procedures / Treatments   Labs (all labs ordered are listed, but only abnormal results are  displayed) Labs Reviewed  COMPREHENSIVE METABOLIC PANEL WITH GFR - Abnormal; Notable for the following components:      Result Value   Potassium 3.4 (*)    Glucose, Bld 108 (*)    All other components within normal limits  CBC - Abnormal; Notable for the following components:   Hemoglobin 11.7 (*)    HCT 35.6 (*)    All other components within normal limits  CBG MONITORING, ED - Abnormal; Notable for the following components:   Glucose-Capillary 118 (*)    All other components within normal limits  LIPASE, BLOOD  ETHANOL  PROTIME-INR  APTT  URINALYSIS, ROUTINE W REFLEX MICROSCOPIC  RAPID URINE DRUG SCREEN, HOSP PERFORMED    EKG None  Radiology CT ANGIO HEAD NECK W WO CM Result Date: 12/19/2023 CLINICAL DATA:  68 year old female with dizziness, vertigo, nausea. EXAM: CT ANGIOGRAPHY HEAD AND NECK WITH AND WITHOUT CONTRAST TECHNIQUE: Multidetector CT imaging of the head and neck was performed using the standard protocol during bolus administration of intravenous contrast. Multiplanar CT image reconstructions and MIPs were obtained to evaluate the vascular anatomy. Carotid stenosis measurements (when applicable) are obtained utilizing NASCET criteria, using the distal internal carotid diameter as the denominator. RADIATION DOSE REDUCTION: This exam was performed according to the departmental dose-optimization program which includes automated exposure control, adjustment of the mA and/or kV according to patient size and/or use of iterative reconstruction technique. CONTRAST:  75mL OMNIPAQUE  IOHEXOL  350 MG/ML SOLN COMPARISON:  None Available. FINDINGS: CT HEAD Brain: Moderate sized chronic right cerebellar hemisphere infarct with circumscribed encephalomalacia (series 4, image 8 and series 6, image 17. Elsewhere brain volume is within normal limits for age. No superimposed midline shift, ventriculomegaly, mass effect, evidence of mass lesion, intracranial hemorrhage or evidence of cortically  based acute infarction. But scattered moderate patchy cerebral white matter hypodensity with involvement of the right greater than left basal ganglia. Calvarium and skull base: Intact. No acute osseous abnormality identified. Paranasal sinuses: Tympanic cavities, paranasal sinuses and mastoids are well aerated. Orbits: Visualized orbits and scalp soft tissues are within normal limits. CTA NECK Skeleton: Cervical spine disc and endplate degeneration, relatively age appropriate. No acute osseous abnormality identified. Upper chest: Negative. Other neck: Negative. Aortic arch: 3 vessel arch.  Calcified aortic atherosclerosis. Right carotid system: Negative brachiocephalic artery. Tortuous right CCA origin but no significant plaque or stenosis. Negative right carotid bifurcation. Partially retropharyngeal course of the cervical right ICA, otherwise negative. Left carotid system: Negative aside from mild tortuosity. Vertebral arteries: Minor atherosclerosis at the right subclavian origin. Normal right vertebral artery origin. Right vertebral artery is patent and appears dominant to  the skull base with no plaque or stenosis. Negative proximal left subclavian artery. Normal left vertebral artery origin. Non dominant left vertebral with V1 paravertebral venous contrast mildly obscuring detail there. The left vertebral remains non dominant and patent to the skull base with no atherosclerosis identified. CTA HEAD Posterior circulation: Dominant right vertebral artery, non dominant left V4 and vertebrobasilar junction are patent without stenosis. Patent PICA origins. Patent basilar artery without stenosis. Patent SCA and PCA origins. Posterior communicating arteries are diminutive or absent. Bilateral PCA branches are within normal limits. Anterior circulation: Both ICA siphons are patent. Distal left siphon mild calcified plaque without stenosis. Right cavernous and supraclinoid ICA siphon mild to moderate calcified plaque.  Mild right supraclinoid stenosis. Patent carotid termini, MCA and ACA origins. Anterior communicating artery and bilateral ACA branches appear normal. Left MCA M1 segment is mildly tortuous and left MCA bifurcation is patent without stenosis. Right MCA M1 and bifurcation are patent without stenosis. Bilateral MCA branches are within normal limits. Venous sinuses: Early contrast timing, grossly patent. Anatomic variants: Dominant right vertebral artery. Review of the MIP images confirms the above findings IMPRESSION: 1. Negative for large vessel occlusion. 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 2. No significant extracranial atherosclerosis. Mild to moderate ICA siphon plaque with no hemodynamically significant arterial stenosis. 3.  Aortic Atherosclerosis (ICD10-I70.0). Electronically Signed   By: Marlise Simpers M.D.   On: 12/19/2023 09:25    Procedures .Critical Care  Performed by: Quinn Bucco, DO Authorized by: Quinn Bucco, DO   Critical care provider statement:    Critical care time (minutes):  76   Critical care was necessary to treat or prevent imminent or life-threatening deterioration of the following conditions: stroke like symtpoms.   Critical care was time spent personally by me on the following activities:  Development of treatment plan with patient or surrogate, discussions with consultants, evaluation of patient's response to treatment, examination of patient, ordering and review of laboratory studies, ordering and review of radiographic studies, ordering and performing treatments and interventions, pulse oximetry, re-evaluation of patient's condition and review of old charts   Care discussed with: admitting provider       Medications Ordered in ED Medications  ondansetron  (ZOFRAN ) injection 4 mg (4 mg Intravenous Not Given 12/19/23 0945)  ondansetron  (ZOFRAN -ODT) disintegrating tablet  4 mg (4 mg Oral Given 12/19/23 0914)  iohexol  (OMNIPAQUE ) 350 MG/ML injection 75 mL (75 mLs Intravenous Contrast Given 12/19/23 0905)    ED Course/ Medical Decision Making/ A&P                   NIH Stroke Scale: 0              Medical Decision Making Amount and/or Complexity of Data Reviewed Labs: ordered. Radiology: ordered.  Risk Prescription drug management.   11:3 AM CC: 68 year old female with past medical history of colon cancer and stroke presenting for complaints of dizziness.  No nystagmus or signs of benign positional vertigo.  Wake up time: 5/6AM on 44/20 Went to bed symptom free: 2400 on 4/19  NIH Stroke Scale: 0  Chart review demonstrates in February 2021 patient had an MRI demonstrating dominant right vertebral artery.Remote right cerebellar hemisphere infarct with associated volume loss. Remote right basal ganglia lacunar infarcts.  Imaging results: CT head w/o: stable CTA head/neck: stable  TPA No Patient will not receive TPA for the following reason(s): Did not meet the time  window for tPA.   Thorough discussion had with patient and/or family/guardian on risks/benefits of tPA and why patient is not a candidate for thrombolytic therapy at this time. tPA will not be given to this patient. ASA will be administered if there are no contraindications or allergies.   Medications: Zofran  for nausea   Recommendations for ED to ED transfer for MRI to rule out acute stroke.  I spoke with ED physician Dr. Scarlette Currier at [facility] who agrees to accept patient.  Patient is otherwise awake and stable at this time.  No changes in neurovascular exam.          Final Clinical Impression(s) / ED Diagnoses Final diagnoses:  Stroke-like symptom  Dizziness    Rx / DC Orders ED Discharge Orders     None         Quinn Bucco, DO 12/19/23 1111

## 2023-12-19 NOTE — ED Triage Notes (Signed)
 Pt to the ed from [facility] with CC of dizziness after waking at 6 am. Pt's last seen well was midnight last night. Pt has a hx of stroke that presented with same symptoms. Pt sent to get MRI.

## 2023-12-19 NOTE — ED Triage Notes (Signed)
 Arrives with complaints of dizziness & vomiting that started this morning when she woke up around 0600. Went sleep normal at midnight. Patient does report having chills and feeling unwell most of the week. Family report patient having similar symptoms when she had a stroke a few years ago.

## 2023-12-19 NOTE — ED Notes (Signed)
 CCMD called and notified

## 2023-12-19 NOTE — Discharge Instructions (Addendum)
 It was a pleasure taking part in your care.  As we discussed, the MRI of your brain is unremarkable for acute findings.  I gave you meclizine  which is a medication for vertigo and your symptoms subsided.  I am sending you home with meclizine .  Please take this every 6 hours as needed for dizziness.  Please follow-up with your PCP.  Return to the ED with any new or worsening symptoms.

## 2024-01-05 ENCOUNTER — Other Ambulatory Visit: Payer: Self-pay | Admitting: Family Medicine

## 2024-01-05 ENCOUNTER — Encounter (HOSPITAL_COMMUNITY): Payer: Self-pay

## 2024-01-05 DIAGNOSIS — Z0000 Encounter for general adult medical examination without abnormal findings: Secondary | ICD-10-CM

## 2024-01-12 ENCOUNTER — Encounter

## 2024-01-12 DIAGNOSIS — Z1231 Encounter for screening mammogram for malignant neoplasm of breast: Secondary | ICD-10-CM

## 2024-02-07 ENCOUNTER — Other Ambulatory Visit: Payer: Self-pay | Admitting: Family Medicine

## 2024-02-07 DIAGNOSIS — Z0000 Encounter for general adult medical examination without abnormal findings: Secondary | ICD-10-CM

## 2024-02-17 ENCOUNTER — Ambulatory Visit
Admission: RE | Admit: 2024-02-17 | Discharge: 2024-02-17 | Disposition: A | Discharge status: Home / Self Care | Source: Ambulatory Visit | Attending: Family Medicine | Admitting: Family Medicine

## 2024-02-17 DIAGNOSIS — Z0000 Encounter for general adult medical examination without abnormal findings: Secondary | ICD-10-CM

## 2024-04-03 ENCOUNTER — Ambulatory Visit

## 2024-04-03 DIAGNOSIS — K219 Gastro-esophageal reflux disease without esophagitis: Secondary | ICD-10-CM | POA: Diagnosis not present

## 2024-04-03 DIAGNOSIS — Z08 Encounter for follow-up examination after completed treatment for malignant neoplasm: Secondary | ICD-10-CM | POA: Diagnosis not present

## 2024-04-03 DIAGNOSIS — K449 Diaphragmatic hernia without obstruction or gangrene: Secondary | ICD-10-CM | POA: Diagnosis not present

## 2024-04-03 DIAGNOSIS — Z1509 Genetic susceptibility to other malignant neoplasm: Secondary | ICD-10-CM | POA: Diagnosis not present

## 2024-04-03 DIAGNOSIS — K640 First degree hemorrhoids: Secondary | ICD-10-CM

## 2024-04-03 DIAGNOSIS — Z85038 Personal history of other malignant neoplasm of large intestine: Secondary | ICD-10-CM | POA: Diagnosis not present

## 2024-04-03 DIAGNOSIS — D125 Benign neoplasm of sigmoid colon: Secondary | ICD-10-CM | POA: Diagnosis not present

## 2024-05-16 ENCOUNTER — Other Ambulatory Visit: Payer: Self-pay | Admitting: Medical Genetics

## 2024-06-07 ENCOUNTER — Other Ambulatory Visit
